# Patient Record
Sex: Male | Born: 1995 | Hispanic: No | Marital: Single | Smoking: Current every day smoker
Health system: Southern US, Community
[De-identification: ages and names within clinical notes are randomized; demographics above are authoritative.]

## PROBLEM LIST (undated history)

## (undated) DIAGNOSIS — J353 Hypertrophy of tonsils with hypertrophy of adenoids: Secondary | ICD-10-CM

## (undated) HISTORY — PX: INGUINAL HERNIA REPAIR: SHX194

---
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
## 2016-07-24 IMAGING — DX DG WRIST COMPLETE 3+V*L*
4 series · 4 of 4 positions shown · non-contrast
Comparison: None.

CLINICAL DATA: Wrist pain, radial side for 2-3 days. No known
injury.

EXAM:
LEFT WRIST - COMPLETE 3+ VIEW

[wrist pa]
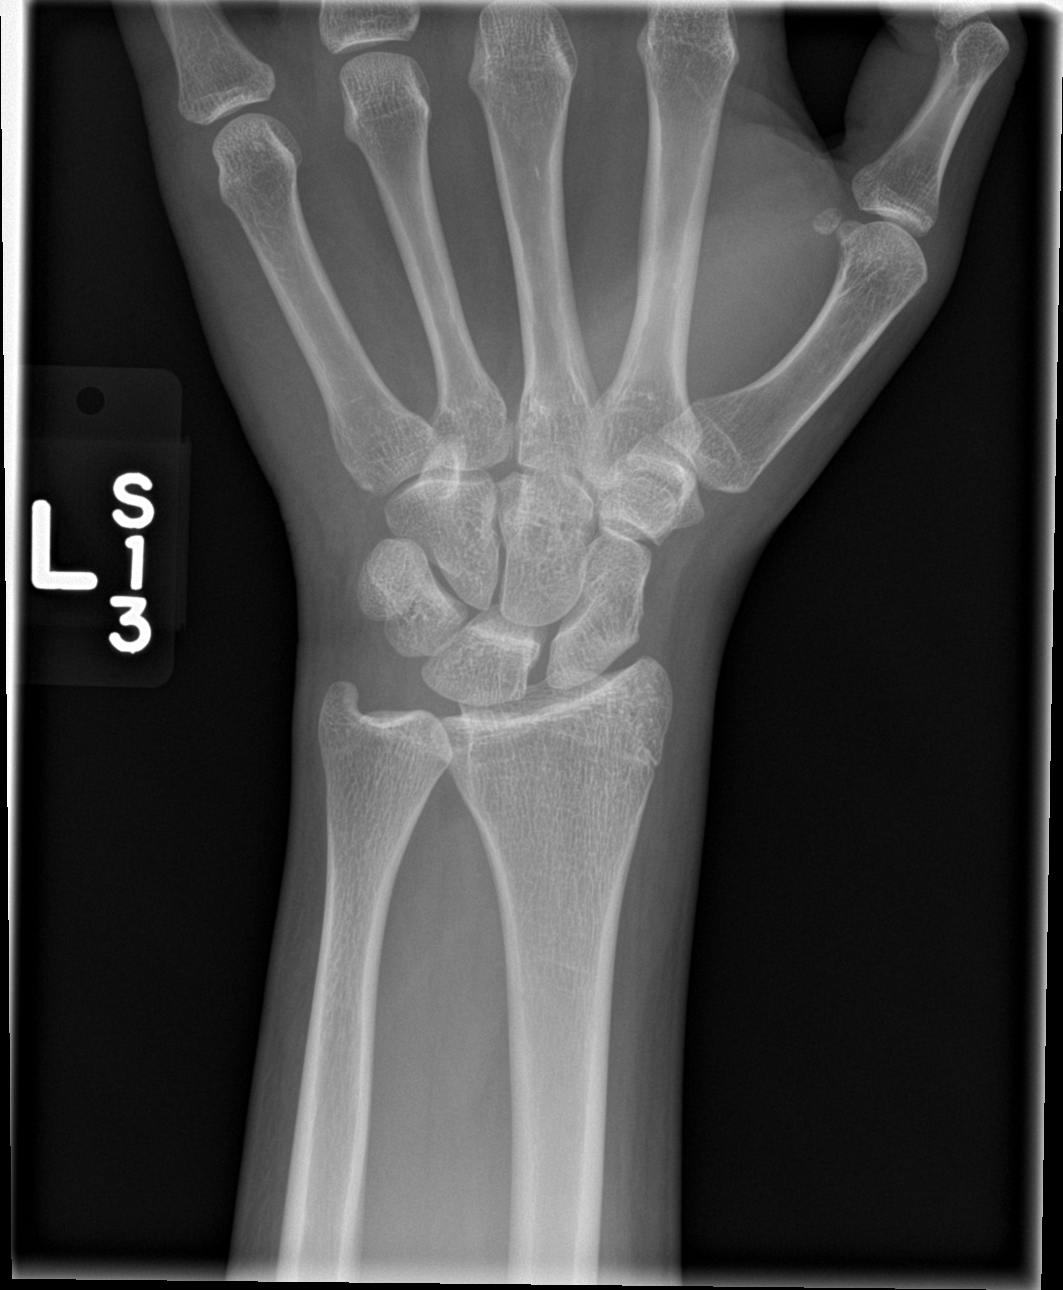

[wrist obl]
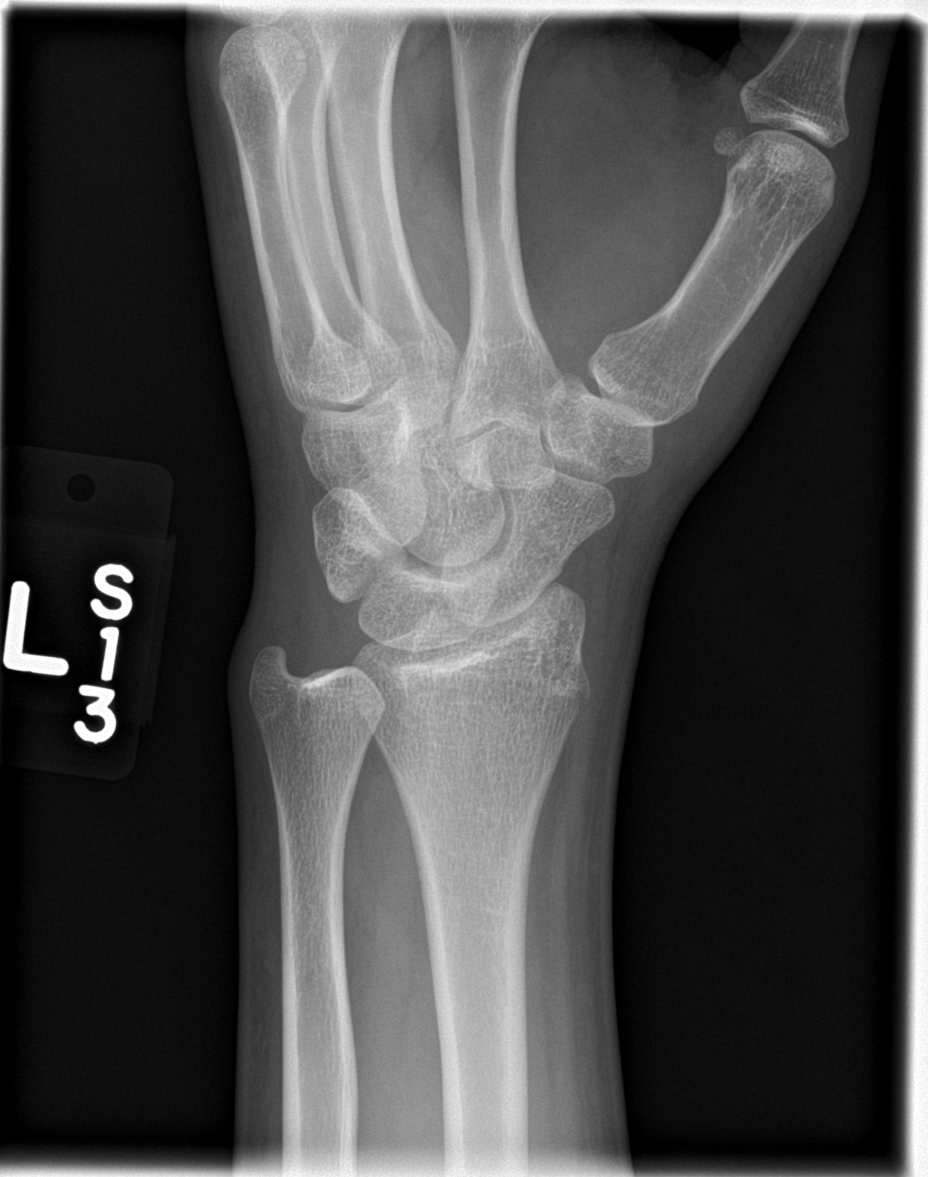

[wrist lat]
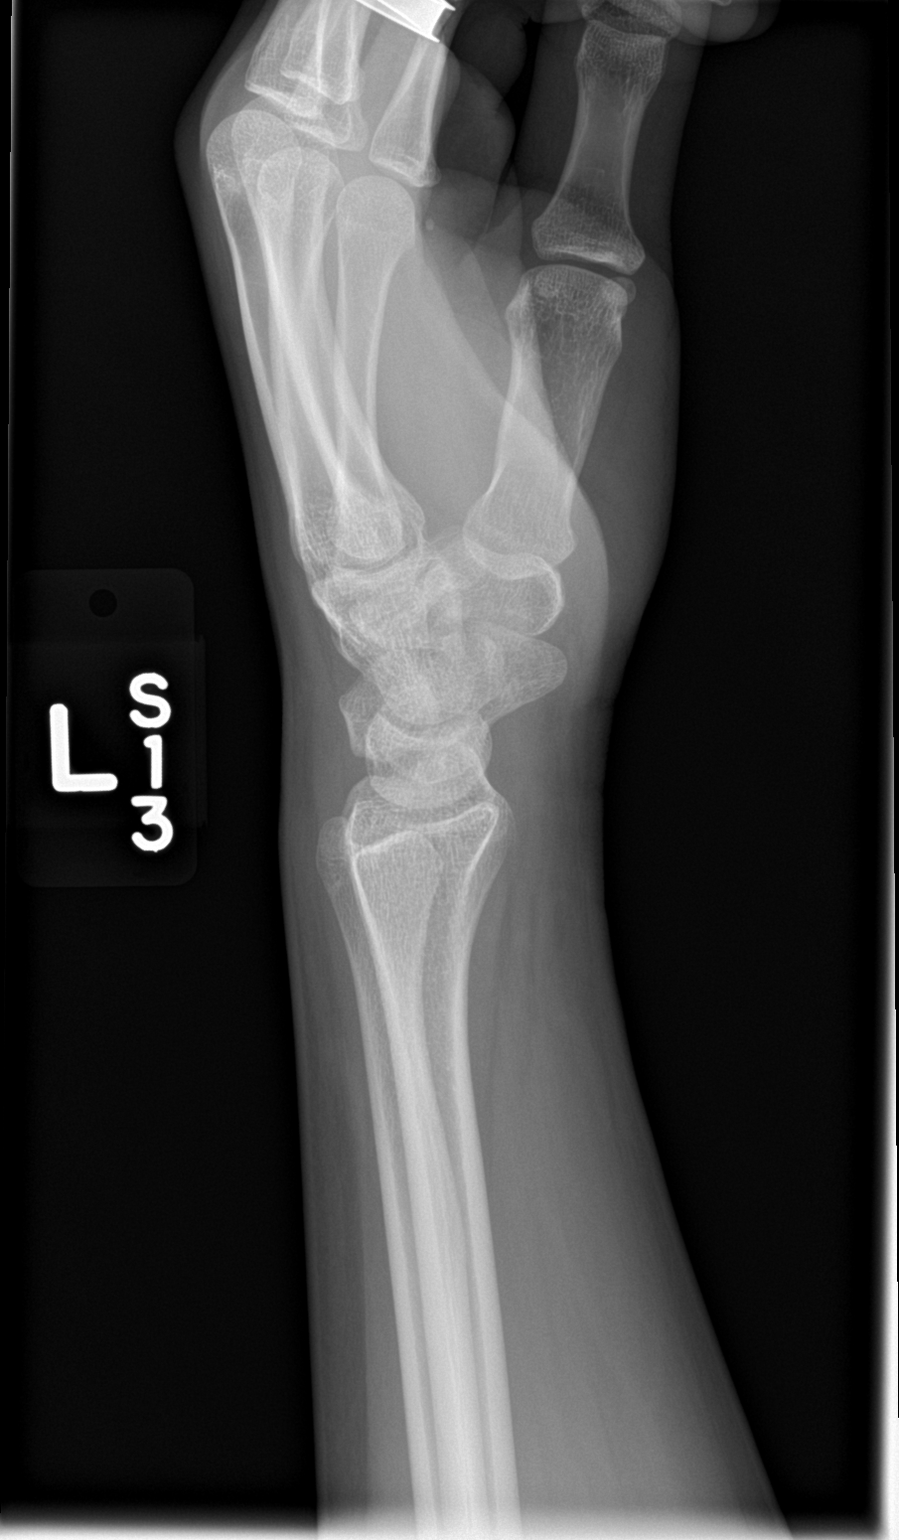

[wrist navicular]
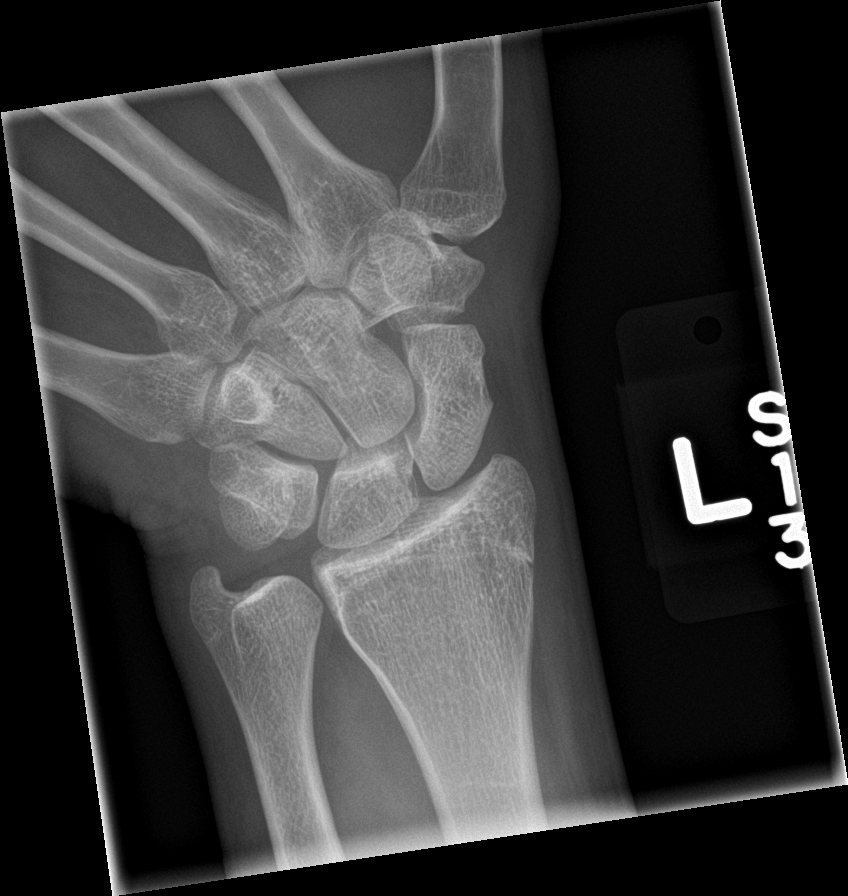

[4 of 4 positions shown; findings below may reference images not displayed]

FINDINGS: There is no evidence of fracture or dislocation. There is no
evidence of arthropathy or other focal bone abnormality. Soft
tissues are unremarkable.
IMPRESSION: Negative.

## 2017-02-08 ENCOUNTER — Encounter (HOSPITAL_COMMUNITY): Payer: Self-pay

## 2017-02-08 ENCOUNTER — Emergency Department (HOSPITAL_COMMUNITY)
Admission: EM | Admit: 2017-02-08 | Discharge: 2017-02-08 | Disposition: A | Discharge status: Home / Self Care | Payer: Worker's Compensation | Attending: Emergency Medicine | Admitting: Emergency Medicine

## 2017-02-08 DIAGNOSIS — Y990 Civilian activity done for income or pay: Secondary | ICD-10-CM | POA: Diagnosis not present

## 2017-02-08 DIAGNOSIS — Y93H3 Activity, building and construction: Secondary | ICD-10-CM | POA: Diagnosis not present

## 2017-02-08 DIAGNOSIS — R51 Headache: Secondary | ICD-10-CM | POA: Insufficient documentation

## 2017-02-08 DIAGNOSIS — Z23 Encounter for immunization: Secondary | ICD-10-CM | POA: Insufficient documentation

## 2017-02-08 DIAGNOSIS — W228XXA Striking against or struck by other objects, initial encounter: Secondary | ICD-10-CM | POA: Diagnosis not present

## 2017-02-08 DIAGNOSIS — S01112A Laceration without foreign body of left eyelid and periocular area, initial encounter: Secondary | ICD-10-CM | POA: Insufficient documentation

## 2017-02-08 DIAGNOSIS — Y929 Unspecified place or not applicable: Secondary | ICD-10-CM | POA: Insufficient documentation

## 2017-02-08 DIAGNOSIS — S0181XA Laceration without foreign body of other part of head, initial encounter: Secondary | ICD-10-CM

## 2017-02-08 DIAGNOSIS — Z7951 Long term (current) use of inhaled steroids: Secondary | ICD-10-CM | POA: Diagnosis not present

## 2017-02-08 DIAGNOSIS — F17210 Nicotine dependence, cigarettes, uncomplicated: Secondary | ICD-10-CM | POA: Insufficient documentation

## 2017-02-08 MED ORDER — ACETAMINOPHEN 500 MG PO TABS
1000.0000 mg | ORAL_TABLET | Freq: Once | ORAL | Status: AC
  Administered 2017-02-08: 1000 mg via ORAL
  Filled 2017-02-08: qty 2

## 2017-02-08 MED ORDER — TETANUS-DIPHTH-ACELL PERTUSSIS 5-2.5-18.5 LF-MCG/0.5 IM SUSP
0.5000 mL | Freq: Once | INTRAMUSCULAR | Status: AC
  Administered 2017-02-08: 0.5 mL via INTRAMUSCULAR
  Filled 2017-02-08: qty 0.5

## 2017-02-08 MED ORDER — DIPHTH-ACELL PERTUSSIS-TETANUS 25-58-10 LF-MCG/0.5 IM SUSP: 0.5000 mL | Freq: Once | INTRAMUSCULAR | Status: DC
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
## 2017-02-08 NOTE — ED Notes (Signed)
Declined W/C at D/C and was escorted to lobby by RN. 

## 2017-11-18 ENCOUNTER — Emergency Department (HOSPITAL_COMMUNITY)
Admission: EM | Admit: 2017-11-18 | Discharge: 2017-11-18 | Disposition: A | Discharge status: Home / Self Care | Payer: Self-pay | Attending: Emergency Medicine | Admitting: Emergency Medicine

## 2017-11-18 ENCOUNTER — Encounter (HOSPITAL_COMMUNITY): Payer: Self-pay | Admitting: *Deleted

## 2017-11-18 ENCOUNTER — Emergency Department (HOSPITAL_COMMUNITY): Payer: Self-pay

## 2017-11-18 ENCOUNTER — Other Ambulatory Visit: Payer: Self-pay

## 2017-11-18 DIAGNOSIS — S63502A Unspecified sprain of left wrist, initial encounter: Secondary | ICD-10-CM | POA: Insufficient documentation

## 2017-11-18 DIAGNOSIS — W01198A Fall on same level from slipping, tripping and stumbling with subsequent striking against other object, initial encounter: Secondary | ICD-10-CM | POA: Insufficient documentation

## 2017-11-18 DIAGNOSIS — Y929 Unspecified place or not applicable: Secondary | ICD-10-CM | POA: Insufficient documentation

## 2017-11-18 DIAGNOSIS — Y9301 Activity, walking, marching and hiking: Secondary | ICD-10-CM | POA: Insufficient documentation

## 2017-11-18 DIAGNOSIS — Y999 Unspecified external cause status: Secondary | ICD-10-CM | POA: Insufficient documentation

## 2017-11-18 DIAGNOSIS — F17210 Nicotine dependence, cigarettes, uncomplicated: Secondary | ICD-10-CM | POA: Insufficient documentation

## 2017-11-18 DIAGNOSIS — Z91040 Latex allergy status: Secondary | ICD-10-CM | POA: Insufficient documentation

## 2017-11-18 MED ORDER — IBUPROFEN 400 MG PO TABS
400.0000 mg | ORAL_TABLET | Freq: Once | ORAL | Status: AC
  Administered 2017-11-18: 400 mg via ORAL
  Filled 2017-11-18: qty 1

## 2017-11-18 NOTE — ED Provider Notes (Signed)
[facility] EMERGENCY DEPARTMENT Provider Note   CSN: [id number] Arrival date & time: 11/18/17  0017   History   Chief Complaint Chief Complaint  Patient presents with  . Wrist Pain    HPI John Camacho is a 22 y.o. male.  The history is provided by the patient.  Wrist Pain  This is a new problem. The current episode started less than 1 hour ago. The problem occurs constantly. The problem has not changed since onset.Pertinent negatives include no headaches. The symptoms are aggravated by bending. The symptoms are relieved by rest.  Reports he was walking from work when he tripped and fell on his left hand.  He reports falling on outstretched hand No other injuries reported.  Reports pain in his left wrist.  Reports some numbness in his left fingers. Past Medical History:  Diagnosis Date  . 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   Past Surgical History:  Procedure Laterality Date  . INGUINAL HERNIA REPAIR     x 2  . TONSILLECTOMY AND ADENOIDECTOMY Bilateral 07/17/2015   Procedure: BILATERAL TONSILLECTOMY AND ADENOIDECTOMY;  Surgeon: Newman Pies, MD;  Location: [facility];  Service: ENT;  Laterality: Bilateral;        Home Medications    Prior to Admission medications   Medication Sig Start Date End Date Taking? Authorizing Provider  albuterol (PROVENTIL HFA;VENTOLIN HFA) 108 (90 Base) MCG/ACT inhaler Inhale 1 puff into the lungs every 6 (six) hours as needed for wheezing or shortness of breath.    [provider]    Family History History reviewed. No pertinent family history.  Social History Social History   Tobacco Use  . Smoking status: Current Every Day Smoker    Packs/day: 1.00    Types: Cigarettes  . Smokeless tobacco: Never Used  . Tobacco comment: 2 cig./day  Substance Use Topics  . Alcohol use: No  . Drug use: No     Allergies   Latex   Review of  Systems Review of Systems  Musculoskeletal: Positive for arthralgias.  Neurological: Negative for headaches.     Physical Exam Updated Vital Signs BP 123/76 (BP Location: Right Arm)   Pulse (!) 54   Temp 98.6 F (37 C) (Oral)   Resp 16   Ht 1.778 m (5\' 10" )   Wt 68 kg (150 lb)   SpO2 99%   BMI 21.52 kg/m   Physical Exam CONSTITUTIONAL: Well developed/well nourished HEAD: Normocephalic/atraumatic EYES: EOMI  ENMT: Mucous membranes moist LUNGS:  no apparent distress ABDOMEN: soft,   NEURO: Pt is awake/alert/appropriate, moves all extremitiesx4.   Marland Kitchen   EXTREMITIES: pulses normal/equal, full ROM, tenderness and mild swelling to left wrist.  No deformities.  Distal pulses intact.  Mild tenderness left hand.  Able to move all fingers without difficulty. No neuro deficit noted his fingers Minimal snuffbox tenderness, most tenderness over ulnar aspect of left wrist SKIN: warm, color normal PSYCH: no abnormalities of mood noted, alert and oriented to situation   ED Treatments / Results  Labs (all labs ordered are listed, but only abnormal results are displayed) Labs Reviewed - No data to display  EKG None  Radiology Dg Wrist Complete Left  Result Date: 11/18/2017 CLINICAL DATA:  Initial evaluation for acute trauma, fall. EXAM: LEFT WRIST - COMPLETE 3+ VIEW COMPARISON:  Prior radiograph from 04/03/2015. FINDINGS: There is no evidence of fracture or dislocation. There is no  evidence of arthropathy or other focal bone abnormality. Soft tissues are unremarkable. IMPRESSION: Negative. Electronically Signed   By: Rise MuBenjamin  McClintock M.D.   On: 11/18/2017 01:11    Procedures Procedures   SPLINT APPLICATION Date/Time: 1:39 AM Authorized by: Joya Gaskinsonald W Michaelina Blandino Consent: Verbal consent obtained. Risks and benefits: risks, benefits and alternatives were discussed Consent given by: patient Splint applied by: nurse Location details: left wrist Splint type: velcro splint Supplies  used: velcro splint Post-procedure: The splinted body part was neurovascularly unchanged following the procedure. Patient tolerance: Patient tolerated the procedure well with no immediate complications.    Medications Ordered in ED Medications  ibuprofen (ADVIL,MOTRIN) tablet 400 mg (400 mg Oral Given 11/18/17 0109)     Initial Impression / Assessment and Plan / ED Course  I have reviewed the triage vital signs and the nursing notes.  Pertinent   imaging results that were available during my care of the patient were reviewed by me and considered in my medical decision making (see chart for details).     No fx noted Plan for removable wrist splint F/u with ortho in a week if no improvement Minimal snuffbox tenderness, doubt scaphoid fx   Recommend ice, NSAIDs, elevate, work note for 2 days  Final Clinical Impressions(s) / ED Diagnoses   Final diagnoses:  Sprain of left wrist, initial encounter    ED Discharge Orders    None       Zadie RhineWickline, Kelise Kuch, MD 11/18/17 0144

## 2017-11-18 NOTE — ED Triage Notes (Signed)
Pt c/o left wrist pain after falling on it; pt able to move fingers; radial pulse present; pt has some limited rom

## 2018-04-26 ENCOUNTER — Encounter (HOSPITAL_COMMUNITY): Payer: Self-pay | Admitting: Emergency Medicine

## 2018-04-26 ENCOUNTER — Emergency Department (HOSPITAL_COMMUNITY)
Admission: EM | Admit: 2018-04-26 | Discharge: 2018-04-26 | Disposition: A | Discharge status: Home / Self Care | Payer: Medicaid Other | Attending: Emergency Medicine | Admitting: Emergency Medicine

## 2018-04-26 DIAGNOSIS — F17210 Nicotine dependence, cigarettes, uncomplicated: Secondary | ICD-10-CM | POA: Insufficient documentation

## 2018-04-26 DIAGNOSIS — J069 Acute upper respiratory infection, unspecified: Secondary | ICD-10-CM | POA: Insufficient documentation

## 2018-04-26 DIAGNOSIS — Z79899 Other long term (current) drug therapy: Secondary | ICD-10-CM | POA: Insufficient documentation

## 2018-04-26 MED ORDER — AZITHROMYCIN 250 MG PO TABS: 250.0000 mg | ORAL_TABLET | Freq: Every day | ORAL | 0 refills | Status: DC

## 2018-04-26 MED ORDER — AZITHROMYCIN 250 MG PO TABS: 250.0000 mg | ORAL_TABLET | Freq: Every day | ORAL | 0 refills | Status: AC

## 2018-04-26 NOTE — ED Triage Notes (Signed)
Pt has had uri symptoms for a week.  Went to work today and had temp of 101.  Supervisor gave Tylenol and cough med and sent home.

## 2018-04-26 NOTE — Discharge Instructions (Addendum)
Were evaluated today for an upper respiratory infection.  I prescribing antibiotic.  Scribed.  Please return to the ED if any the following symptoms:  Get help right away if: You have very bad or constant: Headache. Ear pain. Pain in your forehead, behind your eyes, and over your cheekbones (sinus pain). Chest pain. You have long-lasting (chronic) lung disease and any of the following: Wheezing. Long-lasting cough. Coughing up blood. A change in your usual mucus. You have a stiff neck. You have changes in your: Vision. Hearing. Thinking. Mood.

## 2018-04-26 NOTE — ED Provider Notes (Signed)
[facility] EMERGENCY DEPARTMENT Provider Note   CSN: [id number] Arrival date & time: 04/26/18  1015  History   Chief Complaint Chief Complaint  Patient presents with  . Cough    HPI John Camacho is a 22 y.o. male who presents for evaluation of cough x10 days.  Patient with nasal drainage for approximately 1 week ago with sinus tenderness.  Since then has progressed to a productive cough with green mucus.  Has had a fever temp max 101.  Has not taken anything for his temperature.  Has been nauseous however has not vomited.  Denies chills, chest pain, shortness of breath, abdominal pain, headache, dysuria, diarrhea, constipation.  Patient states he gets similar symptoms once a year with temperature change.  States azithromycin typically works for him as Augmentin has not worked in the past.  Denies aggravating or alleviating factors for symptoms.  HPI  Past Medical History:  Diagnosis Date  . 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   Past Surgical History:  Procedure Laterality Date  . INGUINAL HERNIA REPAIR     x 2  . TONSILLECTOMY AND ADENOIDECTOMY Bilateral 07/17/2015   Procedure: BILATERAL TONSILLECTOMY AND ADENOIDECTOMY;  Surgeon: Newman Pies, MD;  Location: [facility];  Service: ENT;  Laterality: Bilateral;        Home Medications    Prior to Admission medications   Medication Sig Start Date End Date Taking? Authorizing Provider  albuterol (PROVENTIL HFA;VENTOLIN HFA) 108 (90 Base) MCG/ACT inhaler Inhale 1 puff into the lungs every 6 (six) hours as needed for wheezing or shortness of breath.    [provider]  azithromycin (ZITHROMAX) 250 MG tablet Take 1 tablet (250 mg total) by mouth daily. Take first 2 tablets together, then 1 every day until finished. 04/26/18   Henderly, Britni A, PA-C    Family History History reviewed. No pertinent family history.  Social History Social  History   Tobacco Use  . Smoking status: Current Every Day Smoker    Packs/day: 1.00    Types: Cigarettes  . Smokeless tobacco: Never Used  . Tobacco comment: 2 cig./day  Substance Use Topics  . Alcohol use: No  . Drug use: No     Allergies   Latex   Review of Systems Review of Systems  Constitutional: Positive for fever. Negative for activity change, appetite change, chills, diaphoresis and fatigue.  HENT: Positive for congestion, postnasal drip, rhinorrhea and sore throat. Negative for dental problem, drooling, ear discharge, ear pain, facial swelling, hearing loss, nosebleeds, sinus pressure, sinus pain, sneezing, trouble swallowing and voice change.   Eyes: Negative.   Respiratory: Positive for cough. Negative for choking, chest tightness, shortness of breath, wheezing and stridor.   Cardiovascular: Negative.   Gastrointestinal: Negative.   Genitourinary: Negative.   Musculoskeletal: Negative.   Skin: Negative.      Physical Exam Updated Vital Signs BP 116/69 (BP Location: Right Arm)   Pulse (!) 56   Temp 97.8 F (36.6 C) (Oral)   Resp 16   Ht 5\' 11"  (1.803 m)   Wt 69.9 kg   SpO2 100%   BMI 21.48 kg/m   Physical Exam  Constitutional: He appears well-developed and well-nourished. No distress.  HENT:  Head: Normocephalic and atraumatic.  Right Ear: External ear normal.  Left Ear: External ear normal.  Mouth/Throat: Oropharynx is clear and moist.  Eyes: Pupils are equal, round, and reactive to light.  Neck: Normal range of motion. Neck supple.  Cardiovascular: Normal rate, regular rhythm, normal heart sounds and intact distal pulses. Exam reveals no gallop.  No murmur heard. Pulmonary/Chest: Effort normal. No stridor. No respiratory distress. He has no rales. He exhibits no tenderness.  Abdominal: Soft. Bowel sounds are normal. He exhibits no distension and no mass. There is no tenderness. There is no rebound and no guarding. No hernia.  Musculoskeletal:  Normal range of motion.  Neurological: He is alert.  Skin: Skin is warm and dry. He is not diaphoretic.  Psychiatric: He has a normal mood and affect.  Nursing note and vitals reviewed.    ED Treatments / Results  Labs (all labs ordered are listed, but only abnormal results are displayed) Labs Reviewed - No data to display  EKG None  Radiology No results found.  Procedures Procedures (including critical care time)  Medications Ordered in ED Medications - No data to display   Initial Impression / Assessment and Plan / ED Course  I have reviewed the triage vital signs and the nursing notes as well as past medical history.  Pertinent labs & imaging results that were available during my care of the patient were reviewed by me and considered in my medical decision making (see chart for details).  22 year old otherwise healthy male presents for evaluation discuss sinus congestion with fever x10 days.  Has been taking Tylenol for his fever.  One episode of emesis today.  Afebrile, nonseptic, non-ill-appearing on exam.  Presents with upper respiratory symptoms.  Given nature of symptoms x10 days will prescribe antibiotics.  Per patient Augmentin does not work and azithromycin has worked in the past.  Discussed return precautions with patient and mother.  Patient voices understanding.    Final Clinical Impressions(s) / ED Diagnoses   Final diagnoses:  Acute upper respiratory infection    ED Discharge Orders         Ordered    azithromycin (ZITHROMAX) 250 MG tablet  Daily,   Status:  Discontinued     04/26/18 1124    azithromycin (ZITHROMAX) 250 MG tablet  Daily     04/26/18 1139           Henderly, Britni A, PA-C 04/26/18 1712    Blane OharaZavitz, Joshua, MD 04/30/18 1714

## 2018-04-26 NOTE — ED Notes (Signed)
Patient given discharge instruction, verbalized understand. Patient ambulatory out of the department.  

## 2018-07-30 ENCOUNTER — Encounter (HOSPITAL_COMMUNITY): Payer: Self-pay

## 2018-07-30 ENCOUNTER — Other Ambulatory Visit: Payer: Self-pay

## 2018-07-30 ENCOUNTER — Emergency Department (HOSPITAL_COMMUNITY)
Admission: EM | Admit: 2018-07-30 | Discharge: 2018-07-30 | Disposition: A | Discharge status: Home / Self Care | Payer: Medicaid Other | Attending: Emergency Medicine | Admitting: Emergency Medicine

## 2018-07-30 DIAGNOSIS — Y939 Activity, unspecified: Secondary | ICD-10-CM | POA: Insufficient documentation

## 2018-07-30 DIAGNOSIS — Y999 Unspecified external cause status: Secondary | ICD-10-CM | POA: Insufficient documentation

## 2018-07-30 DIAGNOSIS — Y929 Unspecified place or not applicable: Secondary | ICD-10-CM | POA: Insufficient documentation

## 2018-07-30 DIAGNOSIS — S39012A Strain of muscle, fascia and tendon of lower back, initial encounter: Secondary | ICD-10-CM

## 2018-07-30 DIAGNOSIS — X58XXXA Exposure to other specified factors, initial encounter: Secondary | ICD-10-CM | POA: Insufficient documentation

## 2018-07-30 DIAGNOSIS — F17210 Nicotine dependence, cigarettes, uncomplicated: Secondary | ICD-10-CM | POA: Insufficient documentation

## 2018-07-30 MED ORDER — IBUPROFEN 800 MG PO TABS: 800.0000 mg | ORAL_TABLET | Freq: Three times a day (TID) | ORAL | 0 refills | Status: AC

## 2018-07-30 MED ORDER — CYCLOBENZAPRINE HCL 10 MG PO TABS: 10.0000 mg | ORAL_TABLET | Freq: Three times a day (TID) | ORAL | 0 refills | Status: AC | PRN

## 2018-07-30 NOTE — ED Notes (Signed)
Pt reports low back pain since yesterday   Has taken no OTC meds  Here for pain evaluation

## 2018-07-30 NOTE — ED Provider Notes (Signed)
[facility] EMERGENCY DEPARTMENT Provider Note   CSN: [id number] Arrival date & time: 07/30/18  0930     History   Chief Complaint Chief Complaint  Patient presents with  . Back Pain    HPI John Camacho is a 22 y.o. male.  HPI   John Camacho is a 22 y.o. male who presents to the Emergency Department complaining of low back pain for 1 day.  States that he woke this morning with pain across his lower back that is worse with movement and improves with rest.  He denies known injury.  He has not taken any medication for his symptoms.  He describes the pain as sharp and stabbing.  No history of previous back injuries.  He denies abdominal pain, urine or bowel changes, fever, chills, pain numbness or weakness of the lower extremities.   Past Medical History:  Diagnosis Date  . 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   Past Surgical History:  Procedure Laterality Date  . INGUINAL HERNIA REPAIR     x 2  . TONSILLECTOMY AND ADENOIDECTOMY Bilateral 07/17/2015   Procedure: BILATERAL TONSILLECTOMY AND ADENOIDECTOMY;  Surgeon: Newman PiesSu Teoh, MD;  Location: [facility];  Service: ENT;  Laterality: Bilateral;        Home Medications    Prior to Admission medications   Medication Sig Start Date End Date Taking? Authorizing Provider  albuterol (PROVENTIL HFA;VENTOLIN HFA) 108 (90 Base) MCG/ACT inhaler Inhale 1 puff into the lungs every 6 (six) hours as needed for wheezing or shortness of breath.    [provider]  azithromycin (ZITHROMAX) 250 MG tablet Take 1 tablet (250 mg total) by mouth daily. Take first 2 tablets together, then 1 every day until finished. 04/26/18   Henderly, Britni A, PA-C    Family History History reviewed. No pertinent family history.  Social History Social History   Tobacco Use  . Smoking status: Current Every Day Smoker    Packs/day: 1.00    Types: Cigarettes  .  Smokeless tobacco: Never Used  . Tobacco comment: 2 cig./day  Substance Use Topics  . Alcohol use: No  . Drug use: No     Allergies   Latex   Review of Systems Review of Systems  Constitutional: Negative for fever.  Respiratory: Negative for shortness of breath.   Gastrointestinal: Negative for abdominal pain, constipation and vomiting.  Genitourinary: Negative for decreased urine volume, difficulty urinating, dysuria, flank pain and hematuria.  Musculoskeletal: Positive for back pain. Negative for joint swelling.  Skin: Negative for rash.  Neurological: Negative for dizziness, weakness and numbness.     Physical Exam Updated Vital Signs BP 129/73 (BP Location: Right Arm)   Pulse 82   Temp 98.6 F (37 C) (Oral)   Resp 18   Ht 5\' 10"  (1.778 m)   Wt 68 kg   SpO2 98%   BMI 21.52 kg/m   Physical Exam Vitals signs and nursing note reviewed.  Constitutional:      General: He is not in acute distress.    Appearance: He is well-developed.  HENT:     Head: Normocephalic and atraumatic.  Neck:     Musculoskeletal: Normal range of motion and neck supple.  Cardiovascular:     Rate and Rhythm: Normal rate and regular rhythm.     Comments: DP pulses are strong and palpable bilaterally Pulmonary:     Effort: Pulmonary effort  is normal. No respiratory distress.     Breath sounds: Normal breath sounds.  Abdominal:     General: There is no distension.     Palpations: Abdomen is soft.     Tenderness: There is no abdominal tenderness.  Musculoskeletal:        General: Tenderness present.     Lumbar back: He exhibits tenderness and pain. He exhibits normal range of motion, no swelling, no deformity, no laceration and normal pulse.     Comments: ttp of the bilateral lumbar paraspinal muscles.  No spinal tenderness.  Pt has 5/5 strength against resistance of bilateral lower extremities.  Positive straight leg raise bilaterally at 30 degrees.  Flexors and extensors are intact.     Skin:    General: Skin is warm and dry.     Capillary Refill: Capillary refill takes less than 2 seconds.     Findings: No rash.  Neurological:     Mental Status: He is alert and oriented to person, place, and time.     Sensory: No sensory deficit.     Motor: No abnormal muscle tone.     Coordination: Coordination normal.     Gait: Gait normal.     Deep Tendon Reflexes:     Reflex Scores:      Patellar reflexes are 2+ on the right side and 2+ on the left side.      Achilles reflexes are 2+ on the right side and 2+ on the left side.     ED Treatments / Results  Labs (all labs ordered are listed, but only abnormal results are displayed) Labs Reviewed - No data to display  EKG None  Radiology No results found.  Procedures Procedures (including critical care time)  Medications Ordered in ED Medications - No data to display   Initial Impression / Assessment and Plan / ED Course  I have reviewed the triage vital signs and the nursing notes.  Pertinent labs & imaging results that were available during my care of the patient were reviewed by me and considered in my medical decision making (see chart for details).     Patient with low back pain for 1 day.  No injury.  He is neurovascularly intact.  No focal neuro deficits on exam, he ambulates with a steady gait.  I feel that this pain is likely musculoskeletal.  No indication for need for imaging at this time.  Patient agrees to symptomatic treatment plan with anti-inflammatory and muscle relaxer.  He appears appropriate for discharge home.  Return precautions discussed.  Final Clinical Impressions(s) / ED Diagnoses   Final diagnoses:  Strain of lumbar region, initial encounter    ED Discharge Orders    None       Pauline Ausriplett, Jakobe Blau, PA-C 07/30/18 1127    Vanetta MuldersZackowski, Scott, MD 07/31/18 [phone]

## 2018-07-30 NOTE — ED Triage Notes (Signed)
Pt reports sharp pain in lower back 2 days ago. No known injury

## 2018-07-30 NOTE — Discharge Instructions (Addendum)
Alternate ice and heat to your lower back.  Avoid bending over or twisting movements for at least 1 week.  Take the medication as directed.  Follow-up with your primary doctor or return here for any worsening symptoms.

## 2018-08-01 ENCOUNTER — Emergency Department (HOSPITAL_COMMUNITY)
Admission: EM | Admit: 2018-08-01 | Discharge: 2018-08-01 | Disposition: A | Discharge status: Home / Self Care | Payer: Self-pay | Attending: Emergency Medicine | Admitting: Emergency Medicine

## 2018-08-01 ENCOUNTER — Encounter (HOSPITAL_COMMUNITY): Payer: Self-pay

## 2018-08-01 ENCOUNTER — Other Ambulatory Visit: Payer: Self-pay

## 2018-08-01 DIAGNOSIS — Z79899 Other long term (current) drug therapy: Secondary | ICD-10-CM | POA: Insufficient documentation

## 2018-08-01 DIAGNOSIS — R69 Illness, unspecified: Secondary | ICD-10-CM

## 2018-08-01 DIAGNOSIS — F17210 Nicotine dependence, cigarettes, uncomplicated: Secondary | ICD-10-CM | POA: Insufficient documentation

## 2018-08-01 DIAGNOSIS — J111 Influenza due to unidentified influenza virus with other respiratory manifestations: Secondary | ICD-10-CM | POA: Insufficient documentation

## 2018-08-01 LAB — INFLUENZA PANEL BY PCR (TYPE A & B)
INFLAPCR: NEGATIVE
INFLBPCR: POSITIVE — AB

## 2018-08-01 MED ORDER — BENZONATATE 100 MG PO CAPS
200.0000 mg | ORAL_CAPSULE | Freq: Once | ORAL | Status: AC
  Administered 2018-08-01: 200 mg via ORAL
  Filled 2018-08-01: qty 2

## 2018-08-01 MED ORDER — ACETAMINOPHEN 325 MG PO TABS
650.0000 mg | ORAL_TABLET | Freq: Once | ORAL | Status: AC
  Administered 2018-08-01: 650 mg via ORAL
  Filled 2018-08-01: qty 2

## 2018-08-01 MED ORDER — BENZONATATE 100 MG PO CAPS
ORAL_CAPSULE | ORAL | Status: AC
  Filled 2018-08-01: qty 1

## 2018-08-01 NOTE — Discharge Instructions (Addendum)
Rest and drink plenty of fluids.  Alternate Tylenol with your ibuprofen every 4 and 6 hours for body aches and fever.  You may take over-the-counter Mucinex or Robitussin if needed for cough.  Your symptoms may persist for 7 to 10 days.  With your primary doctor or return here for any worsening symptoms.

## 2018-08-01 NOTE — ED Notes (Signed)
Pt is having generalized body aches and a fever. States he did not check it, but he has had cold chills.

## 2018-08-01 NOTE — ED Provider Notes (Signed)
[facility] EMERGENCY DEPARTMENT Provider Note   CSN: [id number] Arrival date & time: 08/01/18  4401     History   Chief Complaint Chief Complaint  Patient presents with  . Generalized Body Aches    HPI John Camacho is a 22 y.o. male.  HPI   John Camacho is a 22 y.o. male who presents to the Emergency Department complaining of generalized body aches, chills, and fever.  Symptoms have been present for 1 day.  If symptoms have also been associated with frontal headache and occasional cough that he describes as nonproductive.  Patient was seen here 2 days ago for back pain.  He is currently taking a muscle relaxer and ibuprofen.  He states his back pain has improved.  He denies chest pain, shortness of breath, vomiting or diarrhea and sore throat.  Past Medical History:  Diagnosis Date  . 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   Past Surgical History:  Procedure Laterality Date  . INGUINAL HERNIA REPAIR     x 2  . TONSILLECTOMY AND ADENOIDECTOMY Bilateral 07/17/2015   Procedure: BILATERAL TONSILLECTOMY AND ADENOIDECTOMY;  Surgeon: Newman Pies, MD;  Location: [facility];  Service: ENT;  Laterality: Bilateral;        Home Medications    Prior to Admission medications   Medication Sig Start Date End Date Taking? Authorizing Provider  albuterol (PROVENTIL HFA;VENTOLIN HFA) 108 (90 Base) MCG/ACT inhaler Inhale 1 puff into the lungs every 6 (six) hours as needed for wheezing or shortness of breath.   Yes [provider]  cyclobenzaprine (FLEXERIL) 10 MG tablet Take 1 tablet (10 mg total) by mouth 3 (three) times daily as needed. 07/30/18  Yes Burk Hoctor, PA-C  ibuprofen (ADVIL,MOTRIN) 200 MG tablet Take 400 mg by mouth every 6 (six) hours as needed.   Yes [provider]  azithromycin (ZITHROMAX) 250 MG tablet Take 1 tablet (250 mg total) by mouth daily. Take first 2 tablets  together, then 1 every day until finished. Patient not taking: Reported on 08/01/2018 04/26/18   Henderly, Britni A, PA-C  ibuprofen (ADVIL,MOTRIN) 800 MG tablet Take 1 tablet (800 mg total) by mouth 3 (three) times daily. Patient not taking: Reported on 08/01/2018 07/30/18   Pauline Aus, PA-C    Family History No family history on file.  Social History Social History   Tobacco Use  . Smoking status: Current Every Day Smoker    Packs/day: 1.00    Types: Cigarettes  . Smokeless tobacco: Never Used  . Tobacco comment: 2 cig./day  Substance Use Topics  . Alcohol use: No  . Drug use: No     Allergies   Latex   Review of Systems Review of Systems  Constitutional: Positive for appetite change, chills and fever. Negative for activity change.  HENT: Positive for congestion. Negative for ear pain, facial swelling, rhinorrhea, sore throat and trouble swallowing.   Eyes: Negative for visual disturbance.  Respiratory: Positive for cough. Negative for shortness of breath, wheezing and stridor.   Cardiovascular: Negative for chest pain.  Gastrointestinal: Negative for abdominal pain, diarrhea, nausea and vomiting.  Genitourinary: Negative for decreased urine volume and dysuria.  Musculoskeletal: Positive for myalgias (Generalized body aches). Negative for neck pain and neck stiffness.  Skin: Negative for rash.  Neurological: Positive for headaches. Negative for dizziness, weakness and numbness.  Hematological: Negative for adenopathy.  Psychiatric/Behavioral: Negative for confusion.  Physical Exam Updated Vital Signs BP (!) 129/96   Pulse (!) 102   Temp 99.1 F (37.3 C)   Resp 18   Wt 68 kg   SpO2 99%   BMI 21.52 kg/m   Physical Exam Vitals signs and nursing note reviewed.  Constitutional:      General: He is not in acute distress.    Appearance: He is well-developed. He is not ill-appearing or toxic-appearing.  HENT:     Head: Atraumatic.     Right Ear:  Tympanic membrane and ear canal normal.     Left Ear: Tympanic membrane and ear canal normal.     Mouth/Throat:     Mouth: Mucous membranes are moist.     Pharynx: Oropharynx is clear. No posterior oropharyngeal erythema.  Eyes:     Conjunctiva/sclera: Conjunctivae normal.     Pupils: Pupils are equal, round, and reactive to light.  Neck:     Musculoskeletal: Full passive range of motion without pain, normal range of motion and neck supple. No neck rigidity, spinous process tenderness or muscular tenderness.     Trachea: Phonation normal.     Meningeal: Kernig's sign absent.  Cardiovascular:     Rate and Rhythm: Normal rate and regular rhythm.     Pulses: Normal pulses.  Pulmonary:     Effort: Pulmonary effort is normal. No respiratory distress.     Breath sounds: Normal breath sounds.  Abdominal:     General: There is no distension.     Palpations: Abdomen is soft.     Tenderness: There is no abdominal tenderness.  Musculoskeletal: Normal range of motion.  Skin:    General: Skin is warm and dry.     Findings: No rash.  Neurological:     General: No focal deficit present.     Mental Status: He is alert.     GCS: GCS eye subscore is 4. GCS verbal subscore is 5. GCS motor subscore is 6.     Cranial Nerves: No cranial nerve deficit.     Sensory: No sensory deficit.     Motor: No abnormal muscle tone.     Gait: Gait is intact. Gait normal.     Comments: CN III-XII grossly intact      ED Treatments / Results  Labs (all labs ordered are listed, but only abnormal results are displayed) Labs Reviewed  INFLUENZA PANEL BY PCR (TYPE A & B)    EKG None  Radiology No results found.  Procedures Procedures (including critical care time)  Medications Ordered in ED Medications  acetaminophen (TYLENOL) tablet 650 mg (has no administration in time range)  benzonatate (TESSALON) capsule 200 mg (has no administration in time range)     Initial Impression / Assessment and Plan  / ED Course  I have reviewed the triage vital signs and the nursing notes.  Pertinent labs & imaging results that were available during my care of the patient were reviewed by me and considered in my medical decision making (see chart for details).     Patient well-appearing.  Vital signs reviewed.  Mucous membranes are moist.  Patient with likely viral process.  Patient reassured.  He agrees to continue with ibuprofen and alternate with Tylenol, over-the-counter antitussives if needed.  Headache was gradual in onset without focal neuro deficit.  No nuchal rigidity.  He appears appropriate for discharge home, return precautions discussed.  Final Clinical Impressions(s) / ED Diagnoses   Final diagnoses:  Influenza-like illness  ED Discharge Orders    None       Pauline Ausriplett, Sairah Knobloch, PA-C 08/01/18 1015    Vanetta MuldersZackowski, Scott, MD 08/02/18 [phone]

## 2018-08-01 NOTE — ED Triage Notes (Signed)
Pt reports generalized body aches and chills since yesterday. Reports coughing and HA

## 2019-03-11 IMAGING — DX DG WRIST COMPLETE 3+V*L*
4 series · 4 of 4 positions shown · non-contrast
Comparison: Prior radiograph from 04/03/2015.

CLINICAL DATA: Initial evaluation for acute trauma, fall.

EXAM:
LEFT WRIST - COMPLETE 3+ VIEW

[wrist pa]
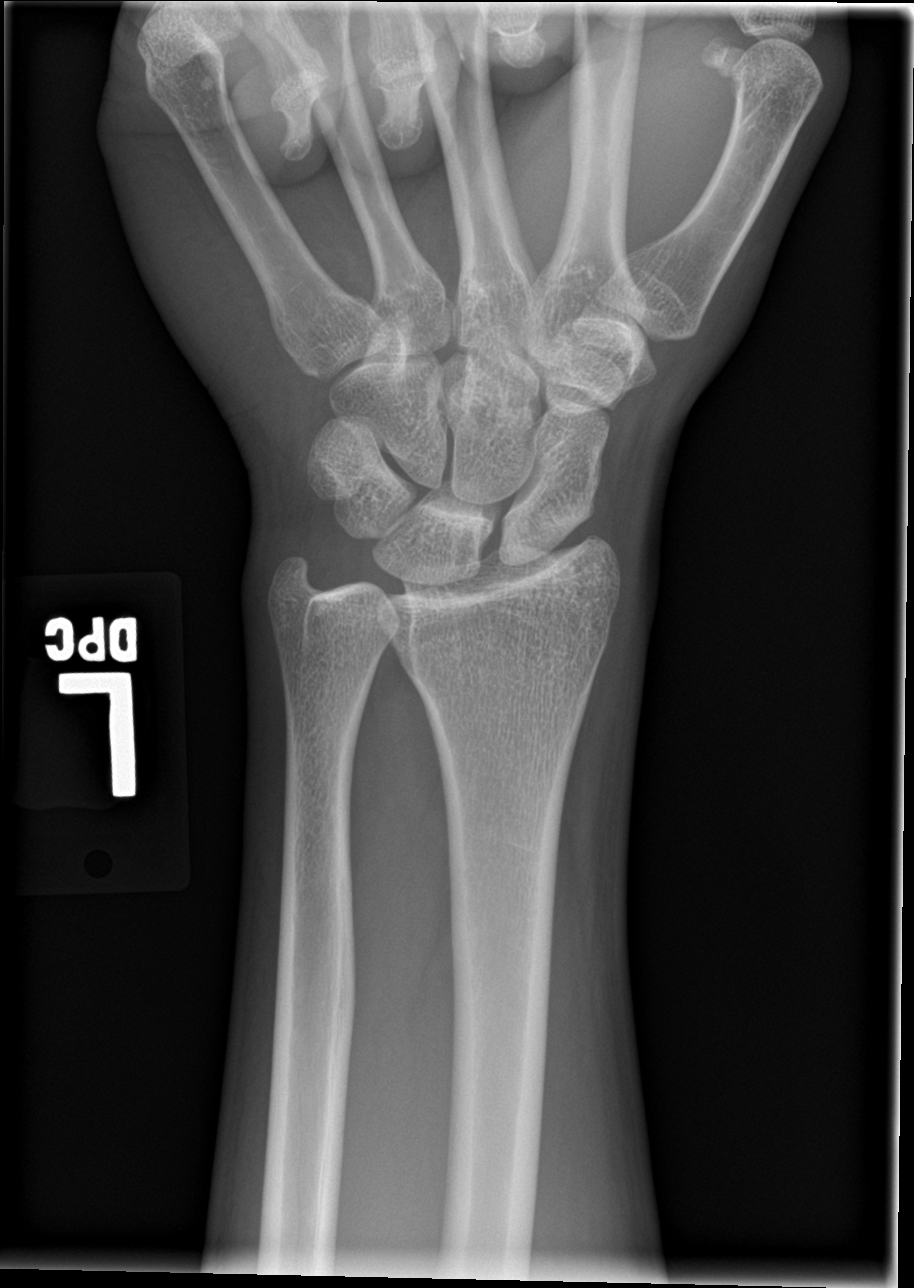

[wrist obl]
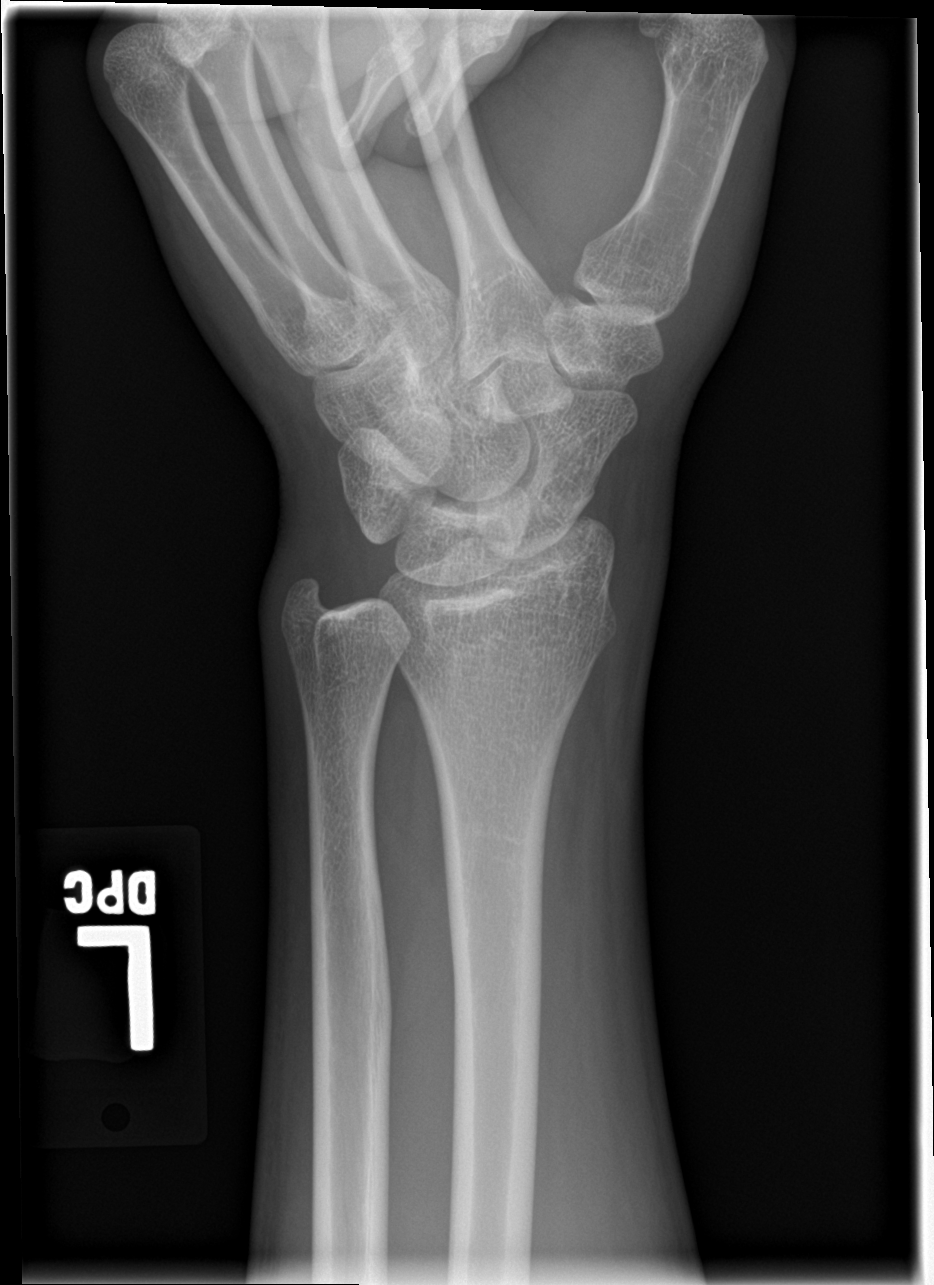

[wrist lat]
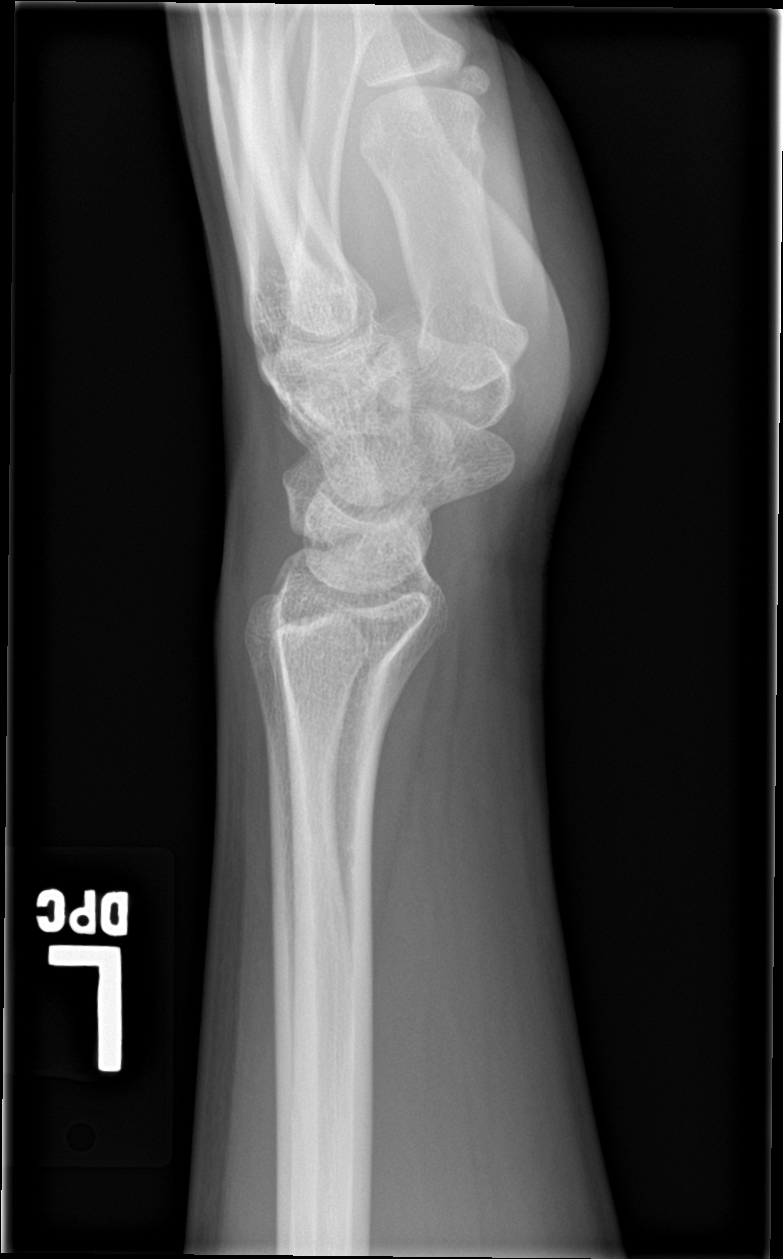

[wrist navicular]
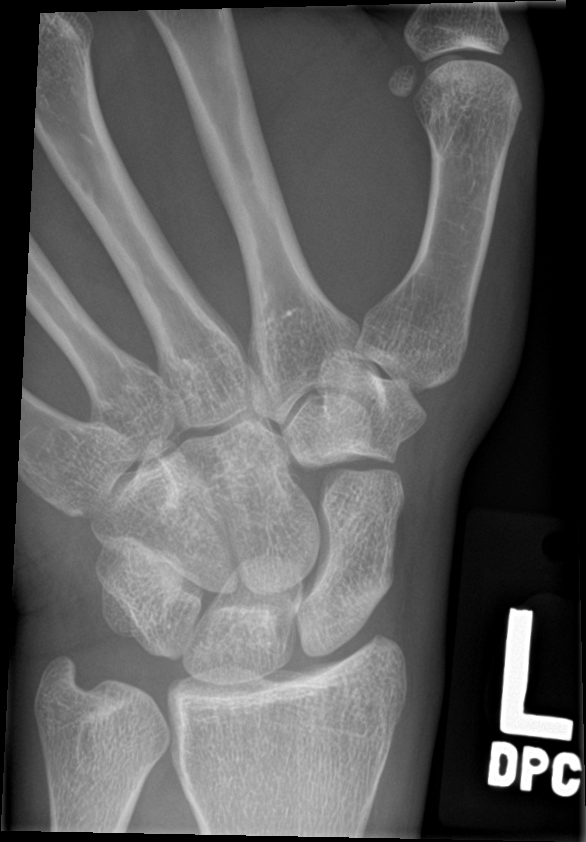

[4 of 4 positions shown; findings below may reference images not displayed]

FINDINGS: There is no evidence of fracture or dislocation. There is no
evidence of arthropathy or other focal bone abnormality. Soft
tissues are unremarkable.
IMPRESSION: Negative.
